# Patient Record
Sex: Male | Born: 1945 | Race: Black or African American | Hispanic: No | Smoking: Current every day smoker
Health system: Southern US, Community
[De-identification: ages and names within clinical notes are randomized; demographics above are authoritative.]

## PROBLEM LIST (undated history)

## (undated) DIAGNOSIS — J302 Other seasonal allergic rhinitis: Secondary | ICD-10-CM

## (undated) DIAGNOSIS — I10 Essential (primary) hypertension: Secondary | ICD-10-CM

---

## 1997-06-15 ENCOUNTER — Emergency Department (HOSPITAL_COMMUNITY): Admission: EM | Admit: 1997-06-15 | Discharge: 1997-06-15 | Payer: Self-pay | Admitting: Emergency Medicine

## 2000-11-06 ENCOUNTER — Emergency Department (HOSPITAL_COMMUNITY): Admission: EM | Admit: 2000-11-06 | Discharge: 2000-11-06 | Payer: Self-pay | Admitting: *Deleted

## 2000-11-08 ENCOUNTER — Emergency Department (HOSPITAL_COMMUNITY): Admission: EM | Admit: 2000-11-08 | Discharge: 2000-11-08 | Payer: Self-pay

## 2007-09-29 ENCOUNTER — Emergency Department (HOSPITAL_COMMUNITY): Admission: EM | Admit: 2007-09-29 | Discharge: 2007-09-29 | Payer: Self-pay | Admitting: Emergency Medicine

## 2007-10-06 ENCOUNTER — Emergency Department (HOSPITAL_COMMUNITY): Admission: EM | Admit: 2007-10-06 | Discharge: 2007-10-06 | Payer: Self-pay | Admitting: Emergency Medicine

## 2008-03-13 ENCOUNTER — Emergency Department (HOSPITAL_COMMUNITY): Admission: EM | Admit: 2008-03-13 | Discharge: 2008-03-13 | Payer: Self-pay | Admitting: Emergency Medicine

## 2008-09-16 ENCOUNTER — Emergency Department (HOSPITAL_COMMUNITY): Admission: EM | Admit: 2008-09-16 | Discharge: 2008-09-16 | Payer: Self-pay | Admitting: Emergency Medicine

## 2008-10-15 ENCOUNTER — Inpatient Hospital Stay (HOSPITAL_COMMUNITY): Admission: EM | Admit: 2008-10-15 | Discharge: 2008-10-17 | Payer: Self-pay | Admitting: Emergency Medicine

## 2008-10-15 ENCOUNTER — Ambulatory Visit: Payer: Self-pay | Admitting: Cardiology

## 2008-10-15 ENCOUNTER — Ambulatory Visit: Payer: Self-pay | Admitting: Gastroenterology

## 2008-10-15 ENCOUNTER — Emergency Department (HOSPITAL_COMMUNITY): Admission: EM | Admit: 2008-10-15 | Discharge: 2008-10-15 | Payer: Self-pay | Admitting: Emergency Medicine

## 2008-10-16 ENCOUNTER — Encounter: Payer: Self-pay | Admitting: Gastroenterology

## 2008-10-17 ENCOUNTER — Encounter (INDEPENDENT_AMBULATORY_CARE_PROVIDER_SITE_OTHER): Payer: Self-pay | Admitting: Internal Medicine

## 2008-10-17 ENCOUNTER — Telehealth: Payer: Self-pay | Admitting: Gastroenterology

## 2008-10-27 ENCOUNTER — Ambulatory Visit: Payer: Self-pay | Admitting: Internal Medicine

## 2008-12-31 ENCOUNTER — Ambulatory Visit: Payer: Self-pay | Admitting: Internal Medicine

## 2009-01-02 ENCOUNTER — Encounter (INDEPENDENT_AMBULATORY_CARE_PROVIDER_SITE_OTHER): Payer: Self-pay | Admitting: Adult Health

## 2009-01-02 ENCOUNTER — Ambulatory Visit: Payer: Self-pay | Admitting: Internal Medicine

## 2009-01-02 LAB — CONVERTED CEMR LAB
ALT: 25 units/L (ref 0–53)
Albumin: 4.7 g/dL (ref 3.5–5.2)
CO2: 30 meq/L (ref 19–32)
Cholesterol: 273 mg/dL — ABNORMAL HIGH (ref 0–200)
Glucose, Bld: 98 mg/dL (ref 70–99)
LDL Cholesterol: 182 mg/dL — ABNORMAL HIGH (ref 0–99)
Potassium: 3.2 meq/L — ABNORMAL LOW (ref 3.5–5.3)
Sodium: 141 meq/L (ref 135–145)
Total Bilirubin: 0.6 mg/dL (ref 0.3–1.2)
Total Protein: 7.6 g/dL (ref 6.0–8.3)
Triglycerides: 71 mg/dL (ref <150)
VLDL: 14 mg/dL (ref 0–40)

## 2009-02-02 ENCOUNTER — Ambulatory Visit: Payer: Self-pay | Admitting: Internal Medicine

## 2009-02-02 ENCOUNTER — Encounter (INDEPENDENT_AMBULATORY_CARE_PROVIDER_SITE_OTHER): Payer: Self-pay | Admitting: Adult Health

## 2009-02-02 LAB — CONVERTED CEMR LAB
ALT: 22 units/L (ref 0–53)
AST: 29 units/L (ref 0–37)
Albumin: 5 g/dL (ref 3.5–5.2)
BUN: 20 mg/dL (ref 6–23)
CO2: 26 meq/L (ref 19–32)
Calcium: 9.6 mg/dL (ref 8.4–10.5)
Chloride: 92 meq/L — ABNORMAL LOW (ref 96–112)
Cholesterol: 233 mg/dL — ABNORMAL HIGH (ref 0–200)
Potassium: 3.1 meq/L — ABNORMAL LOW (ref 3.5–5.3)

## 2009-02-11 ENCOUNTER — Encounter: Payer: Self-pay | Admitting: Internal Medicine

## 2009-02-11 ENCOUNTER — Ambulatory Visit: Payer: Self-pay | Admitting: Cardiology

## 2009-02-11 ENCOUNTER — Ambulatory Visit (HOSPITAL_COMMUNITY): Admission: RE | Admit: 2009-02-11 | Discharge: 2009-02-11 | Payer: Self-pay | Admitting: Internal Medicine

## 2009-02-17 ENCOUNTER — Ambulatory Visit: Payer: Self-pay | Admitting: Internal Medicine

## 2009-03-19 ENCOUNTER — Encounter (INDEPENDENT_AMBULATORY_CARE_PROVIDER_SITE_OTHER): Payer: Self-pay | Admitting: Adult Health

## 2009-03-19 ENCOUNTER — Ambulatory Visit: Payer: Self-pay | Admitting: Internal Medicine

## 2009-03-19 LAB — CONVERTED CEMR LAB
Albumin: 4.5 g/dL (ref 3.5–5.2)
BUN: 13 mg/dL (ref 6–23)
CO2: 28 meq/L (ref 19–32)
Calcium: 9.2 mg/dL (ref 8.4–10.5)
Glucose, Bld: 98 mg/dL (ref 70–99)
Potassium: 4 meq/L (ref 3.5–5.3)
Sodium: 139 meq/L (ref 135–145)
Total Protein: 7.3 g/dL (ref 6.0–8.3)

## 2010-06-12 LAB — CBC
Hemoglobin: 14.1 g/dL (ref 13.0–17.0)
Hemoglobin: 15.9 g/dL (ref 13.0–17.0)
MCHC: 34.2 g/dL (ref 30.0–36.0)
MCV: 93.6 fL (ref 78.0–100.0)
RBC: 4.4 MIL/uL (ref 4.22–5.81)
RBC: 4.93 MIL/uL (ref 4.22–5.81)
RDW: 13.8 % (ref 11.5–15.5)
WBC: 4 10*3/uL (ref 4.0–10.5)

## 2010-06-12 LAB — POCT CARDIAC MARKERS
CKMB, poc: 1.8 ng/mL (ref 1.0–8.0)
CKMB, poc: 1.9 ng/mL (ref 1.0–8.0)
Troponin i, poc: <0.05 ng/mL (ref 0.00–0.09)

## 2010-06-12 LAB — BASIC METABOLIC PANEL
Calcium: 9.4 mg/dL (ref 8.4–10.5)
GFR calc Af Amer: >60 mL/min (ref >60)
GFR calc non Af Amer: 60 mL/min — ABNORMAL LOW (ref >60)
Glucose, Bld: 92 mg/dL (ref 70–99)
Sodium: 135 mEq/L (ref 135–145)

## 2010-06-12 LAB — HEPATIC FUNCTION PANEL
ALT: 30 U/L (ref 0–53)
Alkaline Phosphatase: 41 U/L (ref 39–117)
Bilirubin, Direct: 0.2 mg/dL (ref 0.0–0.3)
Indirect Bilirubin: 0.7 mg/dL (ref 0.3–0.9)

## 2010-06-12 LAB — CARDIAC PANEL(CRET KIN+CKTOT+MB+TROPI)
CK, MB: 3.4 ng/mL (ref 0.3–4.0)
Total CK: 239 U/L — ABNORMAL HIGH (ref 7–232)
Total CK: 277 U/L — ABNORMAL HIGH (ref 7–232)

## 2010-06-12 LAB — LIPID PANEL
Cholesterol: 221 mg/dL — ABNORMAL HIGH (ref 0–200)
HDL: 55 mg/dL (ref >39)
Triglycerides: 137 mg/dL (ref <150)

## 2010-06-12 LAB — DIFFERENTIAL
Basophils Absolute: 0 10*3/uL (ref 0.0–0.1)
Lymphocytes Relative: 36 % (ref 12–46)
Lymphs Abs: 1.4 10*3/uL (ref 0.7–4.0)
Monocytes Absolute: 0.4 10*3/uL (ref 0.1–1.0)
Neutro Abs: 2.2 10*3/uL (ref 1.7–7.7)

## 2010-06-12 LAB — COMPREHENSIVE METABOLIC PANEL
BUN: 14 mg/dL (ref 6–23)
CO2: 31 mEq/L (ref 19–32)
Calcium: 8.7 mg/dL (ref 8.4–10.5)
Creatinine, Ser: 1.09 mg/dL (ref 0.4–1.5)
GFR calc non Af Amer: >60 mL/min (ref >60)
Glucose, Bld: 105 mg/dL — ABNORMAL HIGH (ref 70–99)

## 2010-06-12 LAB — TROPONIN I: Troponin I: 0.02 ng/mL (ref 0.00–0.06)

## 2010-06-12 LAB — CK TOTAL AND CKMB (NOT AT ARMC): Relative Index: 1.4 (ref 0.0–2.5)

## 2010-06-13 LAB — POCT I-STAT, CHEM 8
BUN: 15 mg/dL (ref 6–23)
Calcium, Ion: 1.23 mmol/L (ref 1.12–1.32)
TCO2: 30 mmol/L (ref 0–100)

## 2010-07-20 NOTE — H&P (Signed)
NAMETRESON, LAURA NO.:  [id number]   MEDICAL RECORD NO.:  [id number]          PATIENT TYPE:  EMS   LOCATION:  MAJO                         FACILITY:  [facility]   PHYSICIAN:  Theodosia Paling, MD    DATE OF BIRTH:  1945-07-04   DATE OF ADMISSION:  10/15/2008  DATE OF DISCHARGE:                              HISTORY & PHYSICAL   PRIMARY CARE PHYSICIAN:  The patient follows at [facility].   CHIEF COMPLAINT:  Dysphagia and retrosternal heartburn/chest pain.   HISTORY OF PRESENT ILLNESS:  Mr. Bryan Day is a very pleasant 65-  year-old gentleman with a history of hypertension who is presenting with  a difficulty swallowing his pills.  According to him, he has never seen  a doctor.  He comes frequently to the ER to get his acute issues  resolved.  Recently, according to him, he was prescribed  hydrochlorothiazide for his blood pressure.  However, on swallowing he  feels that it is sticking in the middle of the chest.  He also is  reporting some retrosternal chest pain, heartburn, and he has  experienced discomfort from that.  In the emergency room, his EKG showed  some T wave inversions in inferolateral leads.  Given his history of  hypertension, [facility] was contacted for rule out MI.   REVIEW OF SYSTEMS:  As per HPI.  Otherwise negative.   PAST MEDICAL HISTORY:  Hypertension.   PAST FAMILY HISTORY:  No history of coronary artery disease in the  family or esophageal cancer.   ALLERGIES:  No known drug allergies.   PAST SURGICAL HISTORY:  Some surgery according to him was performed on  his left shoulder.  He does not remember what was done.   SOCIAL HISTORY:  The patient smokes marijuana, according to him 1 joint  every day.  History of alcohol abuse.  He does binge drinking.  Last  drink was 1 week back.  According to him, he has been trying to be  sober.  He does not work currently.  Denies tobacco abuse.  States he  occasionally smokes 1  cigarette, maybe in a month.   EXAMINATION:  VITAL SIGNS:  Blood pressure 180/110 in the emergency  room, pulse 97, pulse ox 97% on room air, temperature afebrile,  respiratory rate 20.  GENERAL:  No acute cardiorespiratory distress.  HEENT:  Pupils equal, round, and reactive to light.  EOMI.  LUNGS:  Normal breath sounds.  No crepitus.  CARDIOVASCULAR:  S1, S2 normal.  No murmur, rub, or gallop heard.  GASTROINTESTINAL:  Soft, nontender.  No organomegaly.  EXTREMITIES:  No pedal edema.  PSYCH:  Alert and oriented x3.  CNS:  Speech intact.  Follows commands.   LABORATORY DATA:  WBC 4.0, hemoglobin 15.9, hematocrit 46.3, platelet  count 173,000, sodium 135, potassium 3.4, chloride 96, bicarbonate 29,  BUN 17, creatinine 1.22, glucose 92.  Cardiac enzymes 2 sets negative.  EKG T wave elevation in inferolateral leads.  Chest x-ray with no acute  cardiopulmonary process.   ASSESSMENT AND PLAN:  1. Chest pain/dysphagia/retrosternal heartburn, likely secondary to  alcohol induced esophagitis.  Will give the patient      cardioprotective medication, put on ACE inhibitor, echocardiogram      for rule out admission, 24 hour telemetry.  Continue to monitor      cardiac enzymes.  However, my suspicion is for alcohol-induced      esophagitis.  Therefore, Dr. Christella Hartigan from GI was called.  The      patient is prepped to be scoped in the morning.  Keep the patient      n.p.o.  Twice a day PPI included.  P.r.n. Mylanta for symptom      relief.  2. Alcohol abuse.  P.r.n. Ativan.  At this time, the patient does not      appear to be in withdrawal.  3. Hypertension.  Continue lisinopril.  Will start the patient on      lisinopril and Coreg.  Monitor for high blood pressure.  4. Thrombocytopenia.  Most likely it is secondary to alcohol abuse.      Continue to follow the count.  5. Code Status:  The patient is Full Code.  6. Total time spent in admission of this patient over 45 minutes.       Theodosia Paling, MD  Electronically Signed     NP/MEDQ  D:  10/15/2008  T:  10/15/2008  Job:  [id number]   cc:   Melvern Banker

## 2010-07-20 NOTE — Discharge Summary (Signed)
Bryan Day, Bryan Day              ACCOUNT NO.:  [id number]   MEDICAL RECORD NO.:  [id number]          PATIENT TYPE:  INP   LOCATION:  4711                         FACILITY:  [facility]   PHYSICIAN:  Herbie Saxon, MDDATE OF BIRTH:  Jun 13, 1945   DATE OF ADMISSION:  10/15/2008  DATE OF DISCHARGE:  10/17/2008                               DISCHARGE SUMMARY   DISCHARGE DIAGNOSES:  1. Chest pain, noncardiac.  2. Gastritis.  3. Duodenitis.  4. Dysphagia.  5. Odynophagia.  6. Hyperlipidemia.  7. Polysubstance abuse.  8. Hypertension, resolved.  9. Mild rhabdomyolysis, improving.   RADIOLOGY:  A chest x-ray of October 15, 2008, showed no acute  cardiopulmonary disease.   PROCEDURES PERFORMED ON THIS ADMISSION:  The patient had upper endoscopy  with biopsy performed by Dr. Rob Bunting on October 16, 2008.  Findings  were moderate-to-severe distal gastritis and duodenitis.  Biopsies were  taken for H. pylori.   CONSULTS:  Please list Dr. Christella Hartigan as one of the consultants,  gastroenterologist on this admission.   HOSPITAL COURSE:  This 65 year old African American male with a history  of hypertension presented with difficulty swallowing his pills,  retrosternal chest pain, and heartburn.  Initial EKG show some T-wave  inversion in inferolateral leads; however, serial cardiac enzymes and  EKG have ruled out an acute MI.  Lipid panel did show hyperlipidemia.  The patient has been started on statins, also placed on beta-blocker to  optimize his blood pressure and Ace inhibitor to optimize his blood  pressure control.  Hypokalemia was repleted as of October 16, 2008.  Hypertension was improved with normal saline, IV fluid hydration.  The  patient has been counseled on drug cessation as he smokes marijuana,  drinks alcohol, and also smokes tobacco.  The patient promises to follow  up as outpatient with outpatient rehab.  Case managers have set him up  with [facility] followup as he has  no primary care physician.  Currently, chest pain-free.   DISCHARGE CONDITION:  Stable.   DIET:  A 2 g sodium, low-cholesterol.   ACTIVITY:  Increased slowly as tolerated.   FOLLOWUP:  To follow up at [facility] in the next 1 week.  A 2D  echocardiogram was done on this admission on October 17, 2008, result is  pending.  Physician at [facility] to follow up is 2D echocardiogram as  outpatient to recommend cardiology followup as needed.   MEDICATIONS ON DISCHARGE:  1. Hydrochlorothiazide 25 mg daily.  2. Metoprolol 25 mg b.i.d.  3. Lisinopril 5 mg daily.  4. Protonix 40 mg twice daily.  5. Prilosec 20 mg twice daily.  6. Simvastatin 40 mg daily.  7. Librium 25 mg twice daily as needed.  8. Percocet 5/325 one to two tablets q.6 h. p.r.n.   PHYSICAL EXAMINATION:  GENERAL:  He is an elderly man not in acute  distress.  VITAL SIGNS:  Stable.  Temperature 97, pulse 62, respiratory rate is 20,  and blood pressure 113/74.  HEENT:  Pupils are equal, reactive to light and accommodation.  Head is  atraumatic and normocephalic.  Mucous membranes  are moist.  Oropharynx  and nasopharynx are clear.  NECK:  Supple.  No lymphadenopathy, thyromegaly or carotid bruits.  CHEST:  Clinically clear.  HEART:  Sounds 1 and 2 regular.  No murmur, gallops, rubs, or clicks.  ABDOMEN:  Benign.  NEUROLOGIC:  He is alert and oriented to time, place, and person.  No  focal neurological deficit.  Peripheral pulses present.  No pedal edema.  Mood is stable.  EXTREMITIES:  No cyanosis, clubbing, or calf edema.  SKIN:  No new skin rash.   LABORATORY DATA:  AST is 32, ALT is 30, total cholesterol 221, and LDL  cholesterol 139.  WBC is 4.1, hematocrit 41, and platelet count 163.  Chemistry, sodium is 137, potassium 3.8, chloride 99, bicarbonate 31,  glucose 105, BUN 14, and creatinine 1.0.   Discharge plan explained to the patient and his family.  They verbalizes  understanding.   Discharge time greater  than 30 minutes.      Herbie Saxon, MD  Electronically Signed     MIO/MEDQ  D:  10/17/2008  T:  10/18/2008  Job:  [id number]

## 2011-03-14 ENCOUNTER — Emergency Department (HOSPITAL_COMMUNITY): Payer: 59

## 2011-03-14 ENCOUNTER — Emergency Department (HOSPITAL_COMMUNITY)
Admission: EM | Admit: 2011-03-14 | Discharge: 2011-03-14 | Disposition: A | Discharge status: Home / Self Care | Payer: 59 | Attending: Emergency Medicine | Admitting: Emergency Medicine

## 2011-03-14 ENCOUNTER — Encounter: Payer: Self-pay | Admitting: Emergency Medicine

## 2011-03-14 ENCOUNTER — Other Ambulatory Visit: Payer: Self-pay

## 2011-03-14 DIAGNOSIS — R509 Fever, unspecified: Secondary | ICD-10-CM | POA: Insufficient documentation

## 2011-03-14 DIAGNOSIS — R935 Abnormal findings on diagnostic imaging of other abdominal regions, including retroperitoneum: Secondary | ICD-10-CM

## 2011-03-14 DIAGNOSIS — Z79899 Other long term (current) drug therapy: Secondary | ICD-10-CM | POA: Insufficient documentation

## 2011-03-14 DIAGNOSIS — R109 Unspecified abdominal pain: Secondary | ICD-10-CM | POA: Insufficient documentation

## 2011-03-14 DIAGNOSIS — F17200 Nicotine dependence, unspecified, uncomplicated: Secondary | ICD-10-CM | POA: Insufficient documentation

## 2011-03-14 DIAGNOSIS — E876 Hypokalemia: Secondary | ICD-10-CM | POA: Insufficient documentation

## 2011-03-14 DIAGNOSIS — J302 Other seasonal allergic rhinitis: Secondary | ICD-10-CM | POA: Insufficient documentation

## 2011-03-14 DIAGNOSIS — I10 Essential (primary) hypertension: Secondary | ICD-10-CM | POA: Insufficient documentation

## 2011-03-14 DIAGNOSIS — M545 Low back pain, unspecified: Secondary | ICD-10-CM | POA: Insufficient documentation

## 2011-03-14 HISTORY — DX: Essential (primary) hypertension: I10

## 2011-03-14 HISTORY — DX: Other seasonal allergic rhinitis: J30.2

## 2011-03-14 LAB — COMPREHENSIVE METABOLIC PANEL WITH GFR
Alkaline Phosphatase: 73 U/L (ref 39–117)
BUN: 14 mg/dL (ref 6–23)
Chloride: 93 meq/L — ABNORMAL LOW (ref 96–112)
Creatinine, Ser: 1.13 mg/dL (ref 0.50–1.35)
GFR calc Af Amer: 76 mL/min — ABNORMAL LOW (ref >90)
GFR calc non Af Amer: 66 mL/min — ABNORMAL LOW (ref >90)
Glucose, Bld: 79 mg/dL (ref 70–99)
Potassium: 3.3 meq/L — ABNORMAL LOW (ref 3.5–5.1)
Total Bilirubin: 0.4 mg/dL (ref 0.3–1.2)

## 2011-03-14 LAB — URINALYSIS, ROUTINE W REFLEX MICROSCOPIC
Bilirubin Urine: NEGATIVE
Glucose, UA: NEGATIVE mg/dL
Hgb urine dipstick: NEGATIVE
Ketones, ur: NEGATIVE mg/dL
Leukocytes, UA: NEGATIVE
Nitrite: NEGATIVE
Protein, ur: NEGATIVE mg/dL
Specific Gravity, Urine: 1.008 (ref 1.005–1.030)
Urobilinogen, UA: 0.2 mg/dL (ref 0.0–1.0)
pH: 6.5 (ref 5.0–8.0)

## 2011-03-14 LAB — CBC
HCT: 45.9 % (ref 39.0–52.0)
Hemoglobin: 16.2 g/dL (ref 13.0–17.0)
MCH: 32.8 pg (ref 26.0–34.0)
MCHC: 35.3 g/dL (ref 30.0–36.0)
MCV: 92.9 fL (ref 78.0–100.0)
Platelets: 237 10*3/uL (ref 150–400)
RBC: 4.94 MIL/uL (ref 4.22–5.81)
RDW: 12.9 % (ref 11.5–15.5)
WBC: 5.9 10*3/uL (ref 4.0–10.5)

## 2011-03-14 LAB — DIFFERENTIAL
Basophils Absolute: 0 10*3/uL (ref 0.0–0.1)
Basophils Relative: 0 % (ref 0–1)
Eosinophils Absolute: 0.1 10*3/uL (ref 0.0–0.7)
Eosinophils Relative: 1 % (ref 0–5)
Lymphocytes Relative: 35 % (ref 12–46)
Lymphs Abs: 2.1 K/uL (ref 0.7–4.0)
Monocytes Absolute: 0.4 K/uL (ref 0.1–1.0)
Monocytes Relative: 6 % (ref 3–12)
Neutro Abs: 3.4 K/uL (ref 1.7–7.7)
Neutrophils Relative %: 58 % (ref 43–77)

## 2011-03-14 LAB — COMPREHENSIVE METABOLIC PANEL
ALT: 16 U/L (ref 0–53)
AST: 19 U/L (ref 0–37)
Albumin: 3.7 g/dL (ref 3.5–5.2)
CO2: 32 mEq/L (ref 19–32)
Calcium: 9.2 mg/dL (ref 8.4–10.5)
Sodium: 134 mEq/L — ABNORMAL LOW (ref 135–145)
Total Protein: 8.4 g/dL — ABNORMAL HIGH (ref 6.0–8.3)

## 2011-03-14 LAB — LIPASE, BLOOD: Lipase: 52 U/L (ref 11–59)

## 2011-03-14 MED ORDER — IOHEXOL 300 MG/ML  SOLN
100.0000 mL | Freq: Once | INTRAMUSCULAR | Status: AC | PRN
  Administered 2011-03-14: 100 mL via INTRAVENOUS

## 2011-03-14 MED ORDER — FENTANYL CITRATE 0.05 MG/ML IJ SOLN
25.0000 ug | Freq: Once | INTRAMUSCULAR | Status: DC
  Filled 2011-03-14: qty 2

## 2011-03-14 MED ORDER — POTASSIUM CHLORIDE CRYS ER 20 MEQ PO TBCR
40.0000 meq | EXTENDED_RELEASE_TABLET | Freq: Once | ORAL | Status: AC
  Administered 2011-03-14: 40 meq via ORAL
  Filled 2011-03-14: qty 2

## 2011-03-14 MED ORDER — ACETAMINOPHEN-CODEINE #3 300-30 MG PO TABS
1.0000 | ORAL_TABLET | Freq: Four times a day (QID) | ORAL | Status: AC | PRN
Start: 2011-03-14 — End: 2011-03-24

## 2011-03-14 MED ORDER — SODIUM CHLORIDE 0.9 % IV SOLN: Freq: Once | INTRAVENOUS | Status: DC

## 2011-03-14 NOTE — ED Notes (Signed)
IV TEAM AT BEDSIDE TO ATTEMPT IV SITE FOR CT AND MEDS

## 2011-03-14 NOTE — ED Notes (Signed)
Pt c/o bilateral flank pain that is keeping him up at night; pt poor historian

## 2011-03-14 NOTE — ED Provider Notes (Signed)
History     CSN: [id number]  Arrival date & time 03/14/11  1226   First MD Initiated Contact with Patient 03/14/11 1618      Chief Complaint  Patient presents with  . Flank Pain    (Consider location/radiation/quality/duration/timing/severity/associated sxs/prior treatment) HPI Comments: Patient has a history of high blood pressure as well as seasonal allergies. He takes lisinopril as well as HCTZ. He takes Benadryl and Claritin as needed for seasonal allergies. He reports one to 2 days prior to Christmas he was out walking and had gotten somewhat wet and draining. He reports that evening he began having rigors, chills, fever associated with pain primarily in his back and his flank areas. This was not associated with urinary urgency, hematuria, vomiting, diarrhea. He did not have rhinorrhea, sore throat or cough. After a couple of days and taking over-the-counter Alka-Seltzer medication, he did feel improved. However the soreness and pain along his flanks which now goes all the way around his upper abdomen and across his lower back that now waxes and wanes. He reports last night the pain in that area did get worse similar to the first time it occurred. He has not had fevers or chills this time however. He denies any significant injuries to his torso or back. He denies chest pain, pleurisy, shortness of breath and no recent sweats. He denies vomiting or diarrhea. Due to the persistence of the discomfort as well as a worsening last night, the patient presents here to the hospital for further evaluation.  Patient is a 66 y.o. male presenting with flank pain. The history is provided by the patient.  Flank Pain Associated symptoms include abdominal pain. Pertinent negatives include no chest pain and no shortness of breath.    Past Medical History  Diagnosis Date  . Hypertension   . Seasonal allergies     History reviewed. No pertinent past surgical history.  History reviewed. No pertinent  family history.  History  Substance Use Topics  . Smoking status: Current Everyday Smoker  . Smokeless tobacco: Not on file  . Alcohol Use: No      Review of Systems  Respiratory: Negative for shortness of breath.   Cardiovascular: Negative for chest pain, palpitations and leg swelling.  Gastrointestinal: Positive for abdominal pain. Negative for nausea, vomiting and diarrhea.  Genitourinary: Positive for flank pain. Negative for dysuria and frequency.  Musculoskeletal: Positive for back pain.  Neurological: Negative for dizziness and light-headedness.  All other systems reviewed and are negative.    Allergies  Review of patient's allergies indicates no known allergies.  Home Medications   Current Outpatient Rx  Name Route Sig Dispense Refill  . DIPHENHYDRAMINE HCL (SLEEP) 25 MG PO TABS Oral Take 50 mg by mouth at bedtime as needed. For allergies     . HYDROCHLOROTHIAZIDE 25 MG PO TABS Oral Take 25 mg by mouth daily.      Marland Kitchen LISINOPRIL 40 MG PO TABS Oral Take 40 mg by mouth daily.      Marland Kitchen LORATADINE 10 MG PO TABS Oral Take 10 mg by mouth daily.        BP 117/88  Pulse 97  Temp(Src) 98.4 F (36.9 C) (Oral)  Resp 18  SpO2 98%  Physical Exam  Nursing note and vitals reviewed. Constitutional: He is oriented to person, place, and time. He appears well-developed and well-nourished. No distress.  HENT:  Head: Normocephalic and atraumatic.  Eyes: Pupils are equal, round, and reactive to light. No scleral icterus.  Neck: Normal range of motion. Neck supple.  Pulmonary/Chest: Breath sounds normal. No respiratory distress.  Abdominal: Soft. He exhibits no distension. There is no hepatosplenomegaly. There is no tenderness. There is no rigidity, no rebound, no guarding, no CVA tenderness, no tenderness at McBurney's point and negative Murphy's sign.  Musculoskeletal: He exhibits no edema.  Neurological: He is alert and oriented to person, place, and time. Coordination and gait  normal.  Skin: Skin is warm. No rash noted.  Psychiatric: He has a normal mood and affect.    ED Course  Procedures (including critical care time)  Labs Reviewed  COMPREHENSIVE METABOLIC PANEL - Abnormal; Notable for the following:    Sodium 134 (*)    Potassium 3.3 (*)    Chloride 93 (*)    Total Protein 8.4 (*)    GFR calc non Af Amer 66 (*)    GFR calc Af Amer 76 (*)    All other components within normal limits  CBC  DIFFERENTIAL  LIPASE, BLOOD  URINALYSIS, ROUTINE W REFLEX MICROSCOPIC   No results found.   No diagnosis found.  Room air oxygen saturation is 98%. I interpret this to be normal.  ECG at time 17:42. Normal sinus rhythm at rate 71. Voltage criteria for LVH is noted. There is improvement of nonspecific T wave changes inferiorly compared to EKG on date 10/15/2008. Normal axis here today.  MDM  Pt has normal vitals except for minimal tachypnea at 18, which has improved here with no intervention.  Pt's age and history concerning for possible infection, will get CT scan of abd/pelvis with contrast.  Can place in CDU for holding.  IV fentanyl ordered for pain relief.   Discussed with PAC Wolfe and Dr. Preston Fleeting.       Gavin Pound. Quame Spratlin, MD 03/15/11 1610

## 2011-03-14 NOTE — ED Provider Notes (Signed)
Shared visit with Dr Oletta Lamas. CT scan abd/pelvis was ordered for further evaluation of bilateral flank pain.  Results for orders placed during the hospital encounter of 03/14/11  CBC      Component Value Range   WBC 5.9  4.0 - 10.5 (K/uL)   RBC 4.94  4.22 - 5.81 (MIL/uL)   Hemoglobin 16.2  13.0 - 17.0 (g/dL)   HCT 40.9  81.1 - 91.4 (%)   MCV 92.9  78.0 - 100.0 (fL)   MCH 32.8  26.0 - 34.0 (pg)   MCHC 35.3  30.0 - 36.0 (g/dL)   RDW 78.2  95.6 - 21.3 (%)   Platelets 237  150 - 400 (K/uL)  DIFFERENTIAL      Component Value Range   Neutrophils Relative 58  43 - 77 (%)   Neutro Abs 3.4  1.7 - 7.7 (K/uL)   Lymphocytes Relative 35  12 - 46 (%)   Lymphs Abs 2.1  0.7 - 4.0 (K/uL)   Monocytes Relative 6  3 - 12 (%)   Monocytes Absolute 0.4  0.1 - 1.0 (K/uL)   Eosinophils Relative 1  0 - 5 (%)   Eosinophils Absolute 0.1  0.0 - 0.7 (K/uL)   Basophils Relative 0  0 - 1 (%)   Basophils Absolute 0.0  0.0 - 0.1 (K/uL)  COMPREHENSIVE METABOLIC PANEL      Component Value Range   Sodium 134 (*) 135 - 145 (mEq/L)   Potassium 3.3 (*) 3.5 - 5.1 (mEq/L)   Chloride 93 (*) 96 - 112 (mEq/L)   CO2 32  19 - 32 (mEq/L)   Glucose, Bld 79  70 - 99 (mg/dL)   BUN 14  6 - 23 (mg/dL)   Creatinine, Ser 0.86  0.50 - 1.35 (mg/dL)   Calcium 9.2  8.4 - 57.8 (mg/dL)   Total Protein 8.4 (*) 6.0 - 8.3 (g/dL)   Albumin 3.7  3.5 - 5.2 (g/dL)   AST 19  0 - 37 (U/L)   ALT 16  0 - 53 (U/L)   Alkaline Phosphatase 73  39 - 117 (U/L)   Total Bilirubin 0.4  0.3 - 1.2 (mg/dL)   GFR calc non Af Amer 66 (*) >90 (mL/min)   GFR calc Af Amer 76 (*) >90 (mL/min)  URINALYSIS, ROUTINE W REFLEX MICROSCOPIC      Component Value Range   Color, Urine YELLOW  YELLOW    APPearance CLEAR  CLEAR    Specific Gravity, Urine 1.008  1.005 - 1.030    pH 6.5  5.0 - 8.0    Glucose, UA NEGATIVE  NEGATIVE (mg/dL)   Hgb urine dipstick NEGATIVE  NEGATIVE    Bilirubin Urine NEGATIVE  NEGATIVE    Ketones, ur NEGATIVE  NEGATIVE (mg/dL)   Protein,  ur NEGATIVE  NEGATIVE (mg/dL)   Urobilinogen, UA 0.2  0.0 - 1.0 (mg/dL)   Nitrite NEGATIVE  NEGATIVE    Leukocytes, UA NEGATIVE  NEGATIVE   LIPASE, BLOOD      Component Value Range   Lipase 52  11 - 59 (U/L)   Ct Abdomen Pelvis W Contrast  03/14/2011  *RADIOLOGY REPORT*  Clinical Data: Bilateral flank pain.  CT ABDOMEN AND PELVIS WITH CONTRAST  Technique:  Multidetector CT imaging of the abdomen and pelvis was performed following the standard protocol during bolus administration of intravenous contrast.  Contrast: OMNIPAQUE IOHEXOL 300 MG/ML IV SOLN  Comparison: None.  Findings: Lung bases are clear.  There is no evidence  for free air. There are scattered low density areas throughout the liver which most likely represent hepatic cysts.  Normal appearance of the gallbladder, pancreas, spleen, adrenal tissue and kidneys.  There is mild fullness of the renal pelvises bilaterally but no significant hydronephrosis.  There appears to be inflammation involving the right small bowel mesentery.  This is best seen on sequence #2, image 48.  Few small lymph nodes along the right ileocolic mesentery but, overall, there is not significant lymphadenopathy.  There is oral contrast within the distal small bowel and right colon.  No evidence to suggest an obstruction.  Unusual small bowel loop or mesentery in the mid lower abdomen on image 67.   Mild wall thickening in the urinary bladder.  No gross abnormality to the prostate or seminal vesicles. No significant free fluid.  No acute bony abnormality.  IMPRESSION: Abnormal appearance of the right small bowel mesentery. Findings could represent acute inflammation but a soft tissue mass cannot be excluded (such as lymphoma).  There is also an unusual small bowel loop in the mid lower abdomen.  No evidence for obstruction.  Etiology of these findings are uncertain and recommend short-term follow-up CT to evaluate for resolution or stability.  Probable hepatic cysts.   These results were called by telephone on 03/14/2011  at 9:05 p.m. to  Dr. Preston Fleeting, who verbally acknowledged these results.  Original Report Authenticated By: Richarda Overlie, M.D.    Labs and CT scan reviewed. No findings to explain pt pain. On re-evaluation, he is relatively pain free. Discussed CT scan findings with patient, he will call Dr Duanne Guess (his PCP) in the morning to arrange close follow-up and schedule a repeat scan.   Oral potassium supplement given in emergency department.  [address] [place], [place] 03/18/11 [phone]

## 2011-03-19 NOTE — ED Provider Notes (Signed)
Medical screening examination/treatment/procedure(s) were conducted as a shared visit with non-physician practitioner(s) and myself.  I personally evaluated the patient during the encounter See my prior note.  Mckynna Vanloan Y.   Gavin Pound. Oletta Lamas, MD 03/19/11 2135

## 2012-09-30 IMAGING — CT CT ABD-PELV W/ CM
2 of 5 series · 17 of 46 positions shown, 19 images · IV contrast (APPLIED)
Comparison: None.

CLINICAL DATA: Bilateral flank pain.

CT ABDOMEN AND PELVIS WITH CONTRAST
TECHNIQUE: Multidetector CT imaging of the abdomen and pelvis was
performed following the standard protocol during bolus
administration of intravenous contrast.
Contrast: 100mL OMNIPAQUE IOHEXOL 300 MG/ML IV SOLN

[Series 2: abd/pelv with 5.0 b31f st · axial · 0.70mm/px · z∈[-432,-42]mm · 14 of 88 slices shown, 16 images]
[im 5/88  soft-tissue]
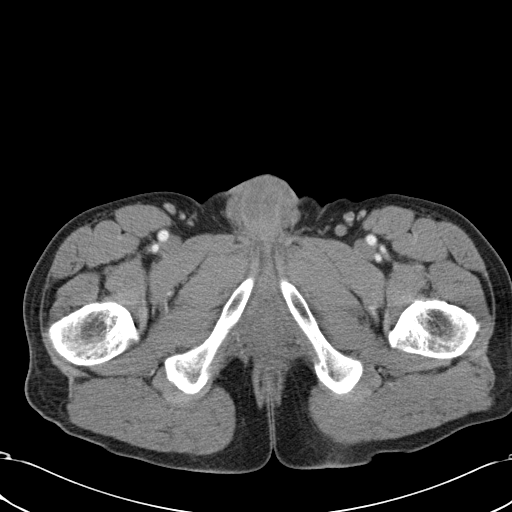
[im 5/88  bone]
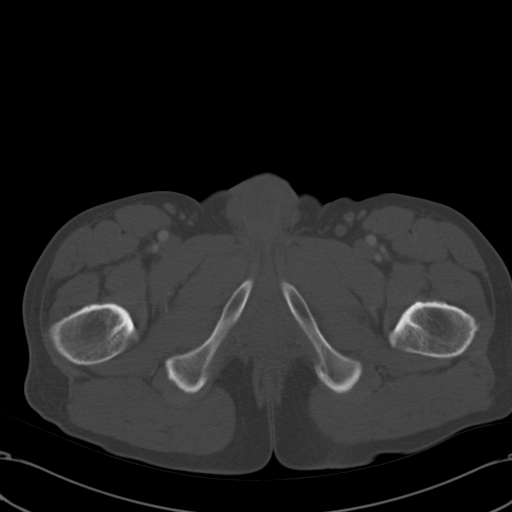
[im 10/88  soft-tissue]
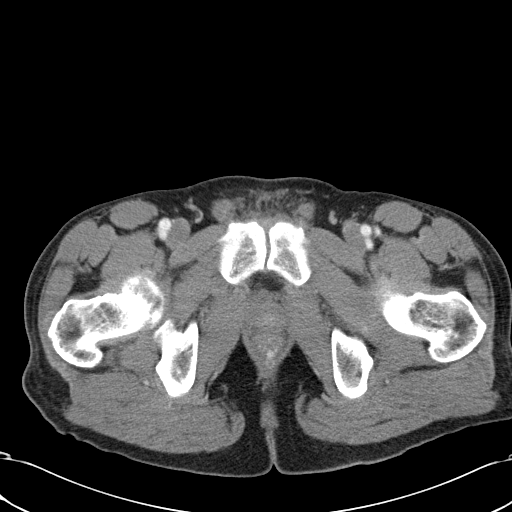
[im 19/88  soft-tissue]
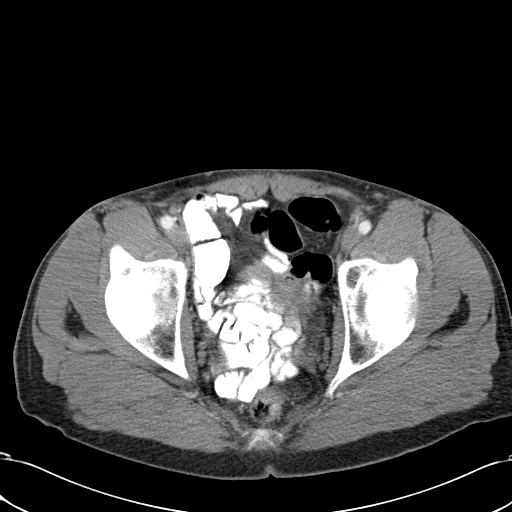
[im 23/88  soft-tissue]
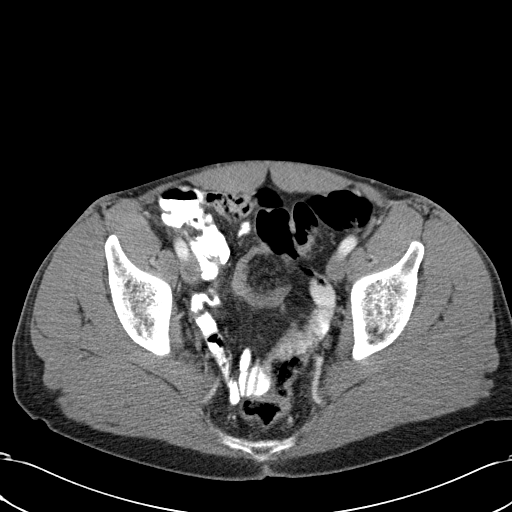
[im 28/88  soft-tissue]
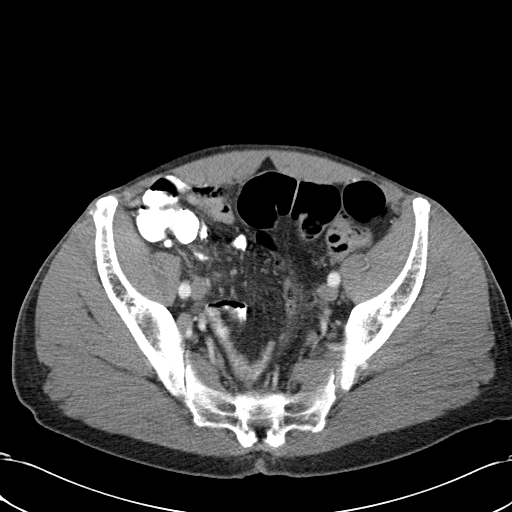
[im 37/88  soft-tissue]
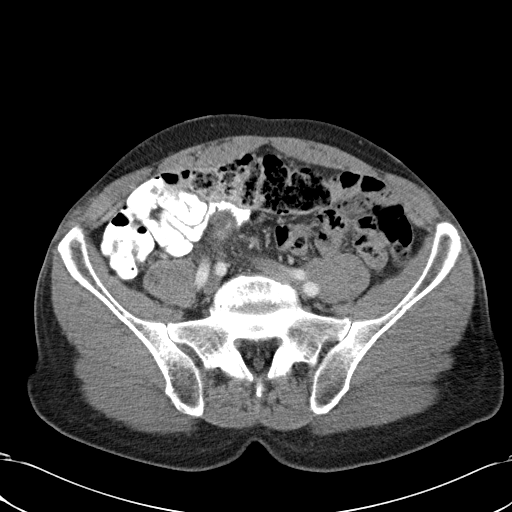
[im 42/88  soft-tissue]
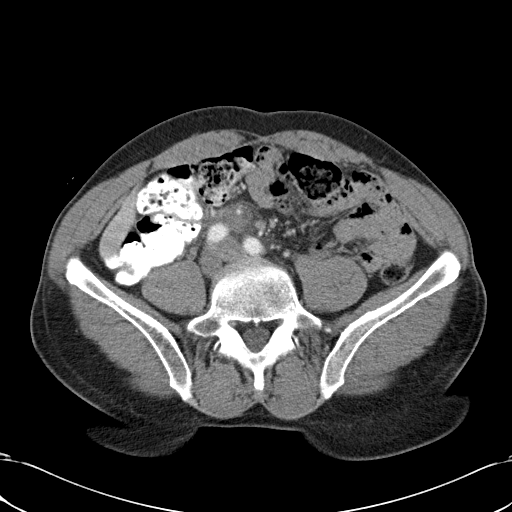
[im 46/88  soft-tissue]
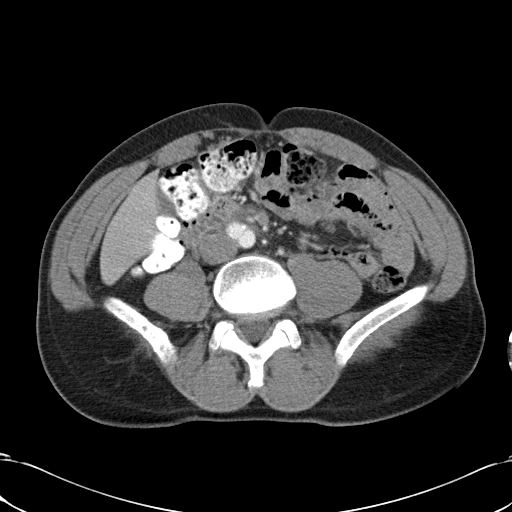
[im 51/88  soft-tissue]
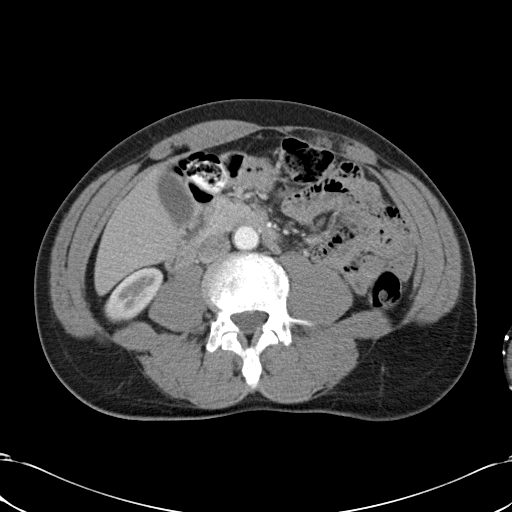
[im 51/88  bone]
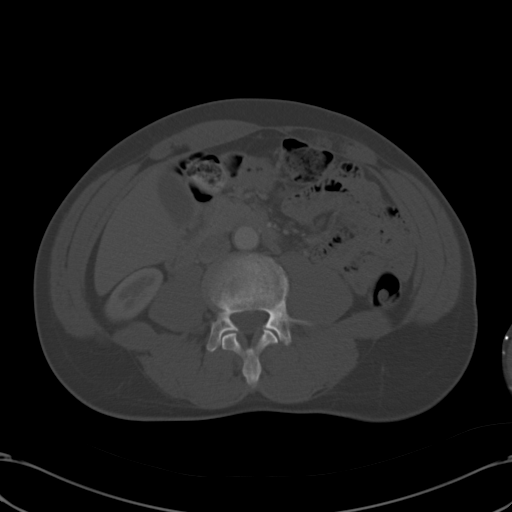
[im 60/88  soft-tissue]
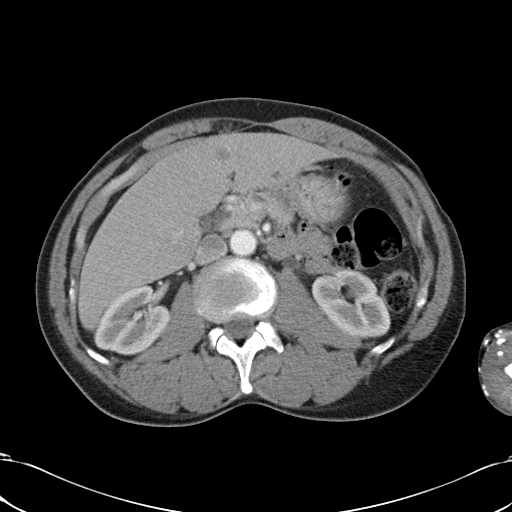
[im 65/88  soft-tissue]
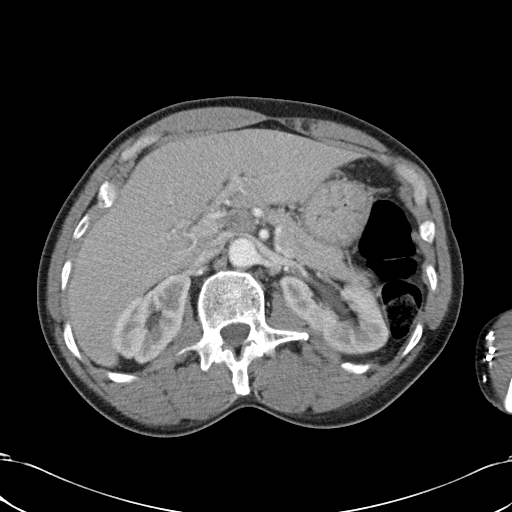
[im 69/88  soft-tissue]
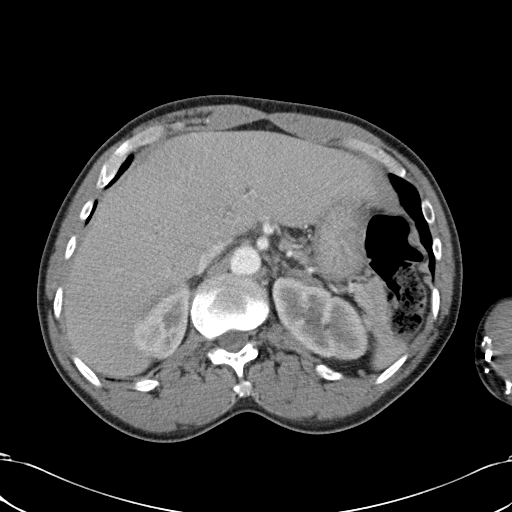
[im 78/88  soft-tissue]
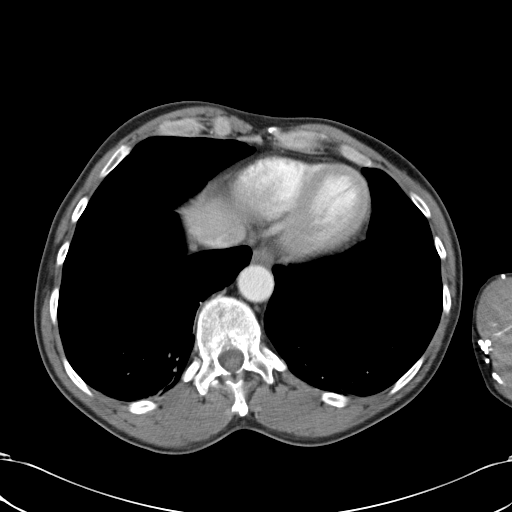
[im 83/88  soft-tissue]
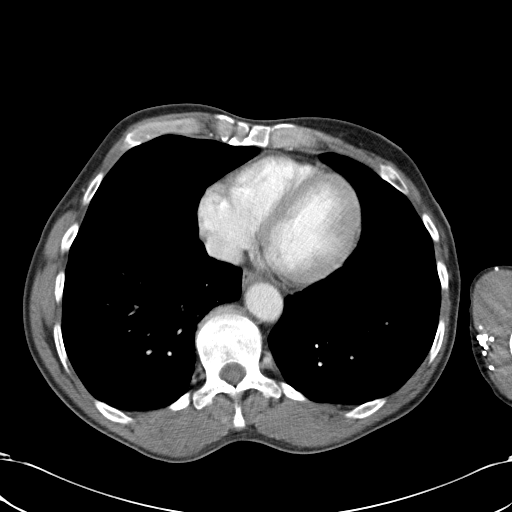

[Series 602: cor · coronal · 0.86mm/px · 3 of 123 slices shown]
[im 41/123  soft-tissue]
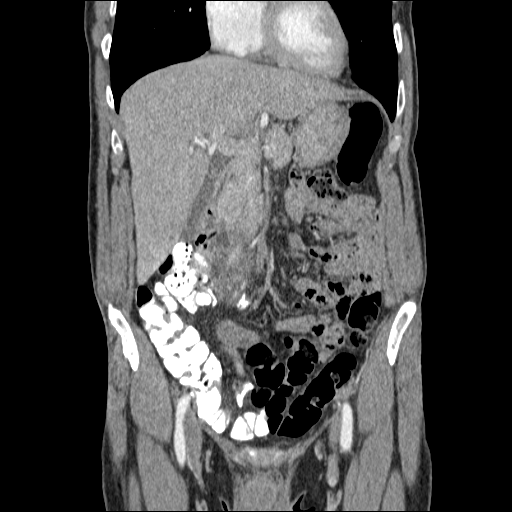
[im 55/123  soft-tissue]
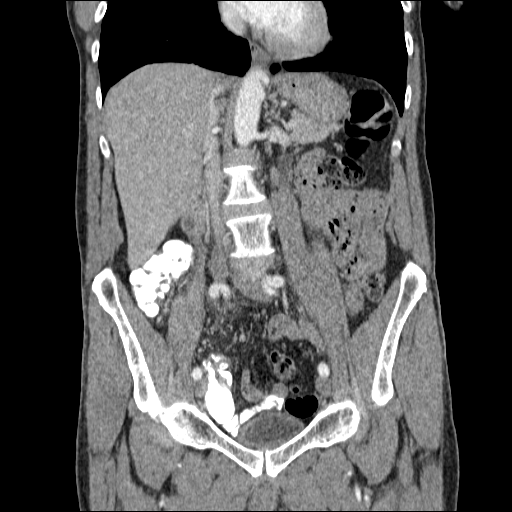
[im 68/123  soft-tissue]
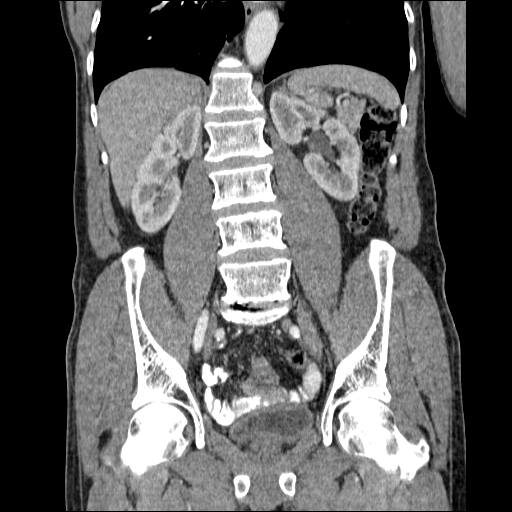

[17 of 46 positions shown; findings below may reference images not displayed]

FINDINGS: Lung bases are clear.  There is no evidence for free air.
There are scattered low density areas throughout the liver which
most likely represent hepatic cysts.  Normal appearance of the
gallbladder, pancreas, spleen, adrenal tissue and kidneys.  There
is mild fullness of the renal pelvises bilaterally but no
significant hydronephrosis.

There appears to be inflammation involving the right small bowel
mesentery.  This is best seen on sequence #2, image 48.  Few small
lymph nodes along the right ileocolic mesentery but, overall, there
is not significant lymphadenopathy.  There is oral contrast within
the distal small bowel and right colon.  No evidence to suggest an
obstruction.  Unusual small bowel loop or mesentery in the mid
lower abdomen on image 67.   Mild wall thickening in the urinary
bladder.  No gross abnormality to the prostate or seminal vesicles.
No significant free fluid.  No acute bony abnormality.
IMPRESSION: Abnormal appearance of the right small bowel mesentery.
Findings could represent acute inflammation but a soft tissue mass
cannot be excluded (such as lymphoma).  There is also an unusual
small bowel loop in the mid lower abdomen.  No evidence for
obstruction.  Etiology of these findings are uncertain and
recommend short-term follow-up CT to evaluate for resolution or
stability.

Probable hepatic cysts.

These results were called by telephone on 03/14/2011  at [DATE] p.m.
to  Dr. Don Lolito, who verbally acknowledged these results.

## 2015-08-31 ENCOUNTER — Encounter (HOSPITAL_COMMUNITY): Payer: Self-pay | Admitting: Emergency Medicine

## 2015-08-31 DIAGNOSIS — F17200 Nicotine dependence, unspecified, uncomplicated: Secondary | ICD-10-CM | POA: Insufficient documentation

## 2015-08-31 DIAGNOSIS — Z79899 Other long term (current) drug therapy: Secondary | ICD-10-CM | POA: Insufficient documentation

## 2015-08-31 DIAGNOSIS — I10 Essential (primary) hypertension: Secondary | ICD-10-CM | POA: Diagnosis not present

## 2015-08-31 NOTE — ED Notes (Addendum)
Pt. reports elevated blood pressure today 176/110 , with mild headache , denies nausea or fever . Alert and oriented/respirations unlabored.

## 2015-09-01 ENCOUNTER — Emergency Department (HOSPITAL_COMMUNITY)
Admission: EM | Admit: 2015-09-01 | Discharge: 2015-09-01 | Disposition: A | Discharge status: Home / Self Care | Payer: Medicare Other | Attending: Emergency Medicine | Admitting: Emergency Medicine

## 2015-09-01 DIAGNOSIS — I10 Essential (primary) hypertension: Secondary | ICD-10-CM

## 2015-09-01 LAB — CBC WITH DIFFERENTIAL/PLATELET
BASOS ABS: 0 10*3/uL (ref 0.0–0.1)
BASOS PCT: 0 %
EOS ABS: 0 10*3/uL (ref 0.0–0.7)
EOS PCT: 0 %
HCT: 41.5 % (ref 39.0–52.0)
Hemoglobin: 14 g/dL (ref 13.0–17.0)
Lymphocytes Relative: 36 %
Lymphs Abs: 2.2 10*3/uL (ref 0.7–4.0)
MCH: 30.7 pg (ref 26.0–34.0)
MCHC: 33.7 g/dL (ref 30.0–36.0)
MCV: 91 fL (ref 78.0–100.0)
Monocytes Absolute: 0.5 10*3/uL (ref 0.1–1.0)
Monocytes Relative: 7 %
NEUTROS PCT: 57 %
Neutro Abs: 3.4 10*3/uL (ref 1.7–7.7)
PLATELETS: 190 10*3/uL (ref 150–400)
RBC: 4.56 MIL/uL (ref 4.22–5.81)
RDW: 14.4 % (ref 11.5–15.5)
WBC: 6.1 10*3/uL (ref 4.0–10.5)

## 2015-09-01 LAB — URINALYSIS, ROUTINE W REFLEX MICROSCOPIC
Bilirubin Urine: NEGATIVE
Glucose, UA: NEGATIVE mg/dL
HGB URINE DIPSTICK: NEGATIVE
Ketones, ur: 15 mg/dL — AB
Leukocytes, UA: NEGATIVE
NITRITE: NEGATIVE
PROTEIN: NEGATIVE mg/dL
Specific Gravity, Urine: 1.026 (ref 1.005–1.030)
pH: 6 (ref 5.0–8.0)

## 2015-09-01 LAB — BASIC METABOLIC PANEL
ANION GAP: 7 (ref 5–15)
BUN: 10 mg/dL (ref 6–20)
CO2: 24 mmol/L (ref 22–32)
Calcium: 9 mg/dL (ref 8.9–10.3)
Chloride: 105 mmol/L (ref 101–111)
Creatinine, Ser: 1.26 mg/dL — ABNORMAL HIGH (ref 0.61–1.24)
GFR, EST NON AFRICAN AMERICAN: 56 mL/min — AB (ref >60)
Glucose, Bld: 90 mg/dL (ref 65–99)
POTASSIUM: 3.3 mmol/L — AB (ref 3.5–5.1)
SODIUM: 136 mmol/L (ref 135–145)

## 2015-09-01 NOTE — ED Provider Notes (Signed)
CSN: [id number]     Arrival date & time 08/31/15  2313 History  By signing my name below, I, Bethel BornBritney McCollum, attest that this documentation has been prepared under the direction and in the presence of Azalia BilisKevin Tanyia Grabbe, MD. Electronically Signed: Bethel BornBritney McCollum, ED Scribe. 09/01/2015. 3:51 AM     Chief Complaint  Patient presents with  . Hypertension   The history is provided by the patient. No language interpreter was used.   Bryan Day is a 70 y.o. male with PMHx of HTN who presents to the Emergency Department complaining of hypertension with onset yesterday.Pt states that his blood pressure ranged from 130-150 at home.  He states that he checked his blood pressure because he woke up feeling "fuzzy headed".  Pt denies chest pain, SOB, extremity weakness, gait difficulty, and dizziness. Pt states that he has been taking his lisinopril and hydrochlorothiazide as prescribed.   Past Medical History  Diagnosis Date  . Hypertension   . Seasonal allergies    History reviewed. No pertinent past surgical history. No family history on file. Social History  Substance Use Topics  . Smoking status: Current Every Day Smoker  . Smokeless tobacco: None  . Alcohol Use: No    Review of Systems 10 Systems reviewed and all are negative for acute change except as noted in the HPI.   Allergies  Review of patient's allergies indicates no known allergies.  Home Medications   Prior to Admission medications   Medication Sig Start Date End Date Taking? Authorizing Provider  diphenhydrAMINE (SOMINEX) 25 MG tablet Take 50 mg by mouth at bedtime as needed. For allergies     Historical Provider, MD  hydrochlorothiazide (HYDRODIURIL) 25 MG tablet Take 25 mg by mouth daily.      Historical Provider, MD  lisinopril (PRINIVIL,ZESTRIL) 40 MG tablet Take 40 mg by mouth daily.      Historical Provider, MD  loratadine (CLARITIN) 10 MG tablet Take 10 mg by mouth daily.      Historical Provider, MD   BP 190/98  mmHg  Pulse 74  Temp(Src) 97.9 F (36.6 C) (Oral)  Resp 16  SpO2 96% Physical Exam  Constitutional: He is oriented to person, place, and time. He appears well-developed and well-nourished.  HENT:  Head: Normocephalic and atraumatic.  Eyes: EOM are normal.  Neck: Normal range of motion.  Cardiovascular: Normal rate, regular rhythm, normal heart sounds and intact distal pulses.   Pulmonary/Chest: Effort normal and breath sounds normal. No respiratory distress.  Abdominal: Soft. He exhibits no distension. There is no tenderness.  Musculoskeletal: Normal range of motion.  Neurological: He is alert and oriented to person, place, and time.  Skin: Skin is warm and dry.  Psychiatric: He has a normal mood and affect. Judgment normal.  Nursing note and vitals reviewed.   ED Course  Procedures (including critical care time) DIAGNOSTIC STUDIES: Oxygen Saturation is 96% on RA,  normal by my interpretation.    COORDINATION OF CARE: 3:49 AM Discussed treatment plan which includes lab work with pt at bedside and pt agreed to plan.  Labs Review Labs Reviewed  BASIC METABOLIC PANEL - Abnormal; Notable for the following:    Potassium 3.3 (*)    Creatinine, Ser 1.26 (*)    GFR calc non Af Amer 56 (*)    All other components within normal limits  URINALYSIS, ROUTINE W REFLEX MICROSCOPIC (NOT AT [facility]) - Abnormal; Notable for the following:    Ketones, ur 15 (*)  All other components within normal limits  CBC WITH DIFFERENTIAL/PLATELET    Imaging Review No results found. I have personally reviewed and evaluated these lab results as part of my medical decision-making.   EKG Interpretation None      MDM   Final diagnoses:  Essential hypertension    Overall the patient is well-appearing.  Initially he was "fuzzy" in the head.  His blood pressures are mildly elevated here.  When I'm in the room his blood pressure is 154 systolic.  No indication for additional treatment or management  of his blood pressure acutely at this time.  He will keep a record her journal of his blood pressures at home taken twice a day and follow-up with his primary care physician regarding titration of his medications.  I've also reiterated dietary changes he can make to help manage his blood pressure as well as daily exercise.  Chest pain shortness breath.  Overall well-appearing.  Discharge home in good condition.  Primary care follow-up.  I personally performed the services described in this documentation, which was scribed in my presence. The recorded information has been reviewed and is accurate.       Azalia BilisKevin Rashee Marschall, MD 09/01/15 [phone]

## 2015-09-01 NOTE — Discharge Instructions (Signed)
Hypertension Hypertension, commonly called high blood pressure, is when the force of blood pumping through your arteries is too strong. Your arteries are the blood vessels that carry blood from your heart throughout your body. A blood pressure reading consists of a higher number over a lower number, such as 110/72. The higher number (systolic) is the pressure inside your arteries when your heart pumps. The lower number (diastolic) is the pressure inside your arteries when your heart relaxes. Ideally you want your blood pressure below 120/80. Hypertension forces your heart to work harder to pump blood. Your arteries may become narrow or stiff. Having untreated or uncontrolled hypertension can cause heart attack, stroke, kidney disease, and other problems. RISK FACTORS Some risk factors for high blood pressure are controllable. Others are not.  Risk factors you cannot control include:   Race. You may be at higher risk if you are African American.  Age. Risk increases with age.  Gender. Men are at higher risk than women before age 45 years. After age 65, women are at higher risk than men. Risk factors you can control include:  Not getting enough exercise or physical activity.  Being overweight.  Getting too much fat, sugar, calories, or salt in your diet.  Drinking too much alcohol. SIGNS AND SYMPTOMS Hypertension does not usually cause signs or symptoms. Extremely high blood pressure (hypertensive crisis) may cause headache, anxiety, shortness of breath, and nosebleed. DIAGNOSIS To check if you have hypertension, your health care provider will measure your blood pressure while you are seated, with your arm held at the level of your heart. It should be measured at least twice using the same arm. Certain conditions can cause a difference in blood pressure between your right and left arms. A blood pressure reading that is higher than normal on one occasion does not mean that you need treatment. If  it is not clear whether you have high blood pressure, you may be asked to return on a different day to have your blood pressure checked again. Or, you may be asked to monitor your blood pressure at home for 1 or more weeks. TREATMENT Treating high blood pressure includes making lifestyle changes and possibly taking medicine. Living a healthy lifestyle can help lower high blood pressure. You may need to change some of your habits. Lifestyle changes may include:  Following the DASH diet. This diet is high in fruits, vegetables, and whole grains. It is low in salt, red meat, and added sugars.  Keep your sodium intake below 2,300 mg per day.  Getting at least 30-45 minutes of aerobic exercise at least 4 times per week.  Losing weight if necessary.  Not smoking.  Limiting alcoholic beverages.  Learning ways to reduce stress. Your health care provider may prescribe medicine if lifestyle changes are not enough to get your blood pressure under control, and if one of the following is true:  You are 18-59 years of age and your systolic blood pressure is above 140.  You are 60 years of age or older, and your systolic blood pressure is above 150.  Your diastolic blood pressure is above 90.  You have diabetes, and your systolic blood pressure is over 140 or your diastolic blood pressure is over 90.  You have kidney disease and your blood pressure is above 140/90.  You have heart disease and your blood pressure is above 140/90. Your personal target blood pressure may vary depending on your medical conditions, your age, and other factors. HOME CARE INSTRUCTIONS    Have your blood pressure rechecked as directed by your health care provider.   Take medicines only as directed by your health care provider. Follow the directions carefully. Blood pressure medicines must be taken as prescribed. The medicine does not work as well when you skip doses. Skipping doses also puts you at risk for  problems.  Do not smoke.   Monitor your blood pressure at home as directed by your health care provider. SEEK MEDICAL CARE IF:   You think you are having a reaction to medicines taken.  You have recurrent headaches or feel dizzy.  You have swelling in your ankles.  You have trouble with your vision. SEEK IMMEDIATE MEDICAL CARE IF:  You develop a severe headache or confusion.  You have unusual weakness, numbness, or feel faint.  You have severe chest or abdominal pain.  You vomit repeatedly.  You have trouble breathing. MAKE SURE YOU:   Understand these instructions.  Will watch your condition.  Will get help right away if you are not doing well or get worse.   This information is not intended to replace advice given to you by your health care provider. Make sure you discuss any questions you have with your health care provider.   Document Released: 02/21/2005 Document Revised: 07/08/2014 Document Reviewed: 12/14/2012 Elsevier Interactive Patient Education 2016 [employer] DASH Eating Plan DASH stands for "Dietary Approaches to Stop Hypertension." The DASH eating plan is a healthy eating plan that has been shown to reduce high blood pressure (hypertension). Additional health benefits may include reducing the risk of type 2 diabetes mellitus, heart disease, and stroke. The DASH eating plan may also help with weight loss. WHAT DO I NEED TO KNOW ABOUT THE DASH EATING PLAN? For the DASH eating plan, you will follow these general guidelines:  Choose foods with a percent daily value for sodium of less than 5% (as listed on the food label).  Use salt-free seasonings or herbs instead of table salt or sea salt.  Check with your health care provider or pharmacist before using salt substitutes.  Eat lower-sodium products, often labeled as "lower sodium" or "no salt added."  Eat fresh foods.  Eat more vegetables, fruits, and low-fat dairy products.  Choose whole grains.  Look for the word "whole" as the first word in the ingredient list.  Choose fish and skinless chicken or turkey more often than red meat. Limit fish, poultry, and meat to 6 oz (170 g) each day.  Limit sweets, desserts, sugars, and sugary drinks.  Choose heart-healthy fats.  Limit cheese to 1 oz (28 g) per day.  Eat more home-cooked food and less restaurant, buffet, and fast food.  Limit fried foods.  Cook foods using methods other than frying.  Limit canned vegetables. If you do use them, rinse them well to decrease the sodium.  When eating at a restaurant, ask that your food be prepared with less salt, or no salt if possible. WHAT FOODS CAN I EAT? Seek help from a dietitian for individual calorie needs. Grains Whole grain or whole wheat bread. Brown rice. Whole grain or whole wheat pasta. Quinoa, bulgur, and whole grain cereals. Low-sodium cereals. Corn or whole wheat flour tortillas. Whole grain cornbread. Whole grain crackers. Low-sodium crackers. Vegetables Fresh or frozen vegetables (raw, steamed, roasted, or grilled). Low-sodium or reduced-sodium tomato and vegetable juices. Low-sodium or reduced-sodium tomato sauce and paste. Low-sodium or reduced-sodium canned vegetables.  Fruits All fresh, canned (in natural juice), or frozen fruits. Meat and Other   Protein Products Ground beef (85% or leaner), grass-fed beef, or beef trimmed of fat. Skinless chicken or turkey. Ground chicken or turkey. Pork trimmed of fat. All fish and seafood. Eggs. Dried beans, peas, or lentils. Unsalted nuts and seeds. Unsalted canned beans. Dairy Low-fat dairy products, such as skim or 1% milk, 2% or reduced-fat cheeses, low-fat ricotta or cottage cheese, or plain low-fat yogurt. Low-sodium or reduced-sodium cheeses. Fats and Oils Tub margarines without trans fats. Light or reduced-fat mayonnaise and salad dressings (reduced sodium). Avocado. Safflower, olive, or canola oils. Natural peanut or almond  butter. Other Unsalted popcorn and pretzels. The items listed above may not be a complete list of recommended foods or beverages. Contact your dietitian for more options. WHAT FOODS ARE NOT RECOMMENDED? Grains White bread. White pasta. White rice. Refined cornbread. Bagels and croissants. Crackers that contain trans fat. Vegetables Creamed or fried vegetables. Vegetables in a cheese sauce. Regular canned vegetables. Regular canned tomato sauce and paste. Regular tomato and vegetable juices. Fruits Dried fruits. Canned fruit in light or heavy syrup. Fruit juice. Meat and Other Protein Products Fatty cuts of meat. Ribs, chicken wings, bacon, sausage, bologna, salami, chitterlings, fatback, hot dogs, bratwurst, and packaged luncheon meats. Salted nuts and seeds. Canned beans with salt. Dairy Whole or 2% milk, cream, half-and-half, and cream cheese. Whole-fat or sweetened yogurt. Full-fat cheeses or blue cheese. Nondairy creamers and whipped toppings. Processed cheese, cheese spreads, or cheese curds. Condiments Onion and garlic salt, seasoned salt, table salt, and sea salt. Canned and packaged gravies. Worcestershire sauce. Tartar sauce. Barbecue sauce. Teriyaki sauce. Soy sauce, including reduced sodium. Steak sauce. Fish sauce. Oyster sauce. Cocktail sauce. Horseradish. Ketchup and mustard. Meat flavorings and tenderizers. Bouillon cubes. Hot sauce. Tabasco sauce. Marinades. Taco seasonings. Relishes. Fats and Oils Butter, stick margarine, lard, shortening, ghee, and bacon fat. Coconut, palm kernel, or palm oils. Regular salad dressings. Other Pickles and olives. Salted popcorn and pretzels. The items listed above may not be a complete list of foods and beverages to avoid. Contact your dietitian for more information. WHERE CAN I FIND MORE INFORMATION? National Heart, Lung, [employer] Institute: www.nhlbi.nih.gov/health/health-topics/topics/dash/   This information is not intended to replace  advice given to you by your health care provider. Make sure you discuss any questions you have with your health care provider.   Document Released: 02/10/2011 Document Revised: 03/14/2014 Document Reviewed: 12/26/2012 Elsevier Interactive Patient Education 2016 [employer]  

## 2017-03-30 ENCOUNTER — Other Ambulatory Visit (HOSPITAL_COMMUNITY): Payer: Self-pay | Admitting: Family Medicine

## 2017-03-30 DIAGNOSIS — Z136 Encounter for screening for cardiovascular disorders: Secondary | ICD-10-CM

## 2017-04-10 ENCOUNTER — Ambulatory Visit (HOSPITAL_COMMUNITY): Admission: RE | Admit: 2017-04-10 | Payer: Medicare HMO | Source: Ambulatory Visit

## 2017-04-11 ENCOUNTER — Ambulatory Visit: Payer: Self-pay | Admitting: Sports Medicine

## 2017-05-08 ENCOUNTER — Ambulatory Visit (HOSPITAL_COMMUNITY)
Admission: RE | Admit: 2017-05-08 | Discharge: 2017-05-08 | Disposition: A | Discharge status: Home / Self Care | Payer: Medicare Other | Source: Ambulatory Visit | Attending: Family Medicine | Admitting: Family Medicine

## 2017-05-08 DIAGNOSIS — Z136 Encounter for screening for cardiovascular disorders: Secondary | ICD-10-CM | POA: Insufficient documentation

## 2017-05-08 DIAGNOSIS — I77811 Abdominal aortic ectasia: Secondary | ICD-10-CM | POA: Insufficient documentation
